# Patient Record
Sex: Male | Born: 1970 | Race: White | Hispanic: No | Marital: Single | Smoking: Never smoker
Health system: Southern US, Community
[De-identification: ages and names within clinical notes are randomized; demographics above are authoritative.]

---

## 2005-01-17 ENCOUNTER — Encounter: Admission: RE | Admit: 2005-01-17 | Discharge: 2005-01-17 | Payer: Self-pay | Admitting: Occupational Medicine

## 2005-03-15 ENCOUNTER — Ambulatory Visit (HOSPITAL_COMMUNITY): Admission: RE | Admit: 2005-03-15 | Discharge: 2005-03-15 | Payer: Self-pay | Admitting: Urology

## 2006-01-04 IMAGING — CT CT PELVIS W/O CM
1 series · 15 of 32 positions shown, 19 images · IV contrast (agent unspecified)
Comparison: Abdominal radiographs done earlier today.

CLINICAL DATA: Right flank pain and nausea, post lithotripsy.  
 ABDOMEN CT WITHOUT CONTRAST:
TECHNIQUE: Multidetector CT imaging of the abdomen was performed following the standard protocol without IV contrast.
TECHNIQUE: Multidetector CT imaging of the pelvis was performed following the standard protocol without IV contrast.

[Series 2: stone_wo 5.0 b40f st · axial · 0.65mm/px · z∈[+1340,+1760]mm · 15 of 117 slices shown, 19 images]
[im 8/117  soft-tissue]
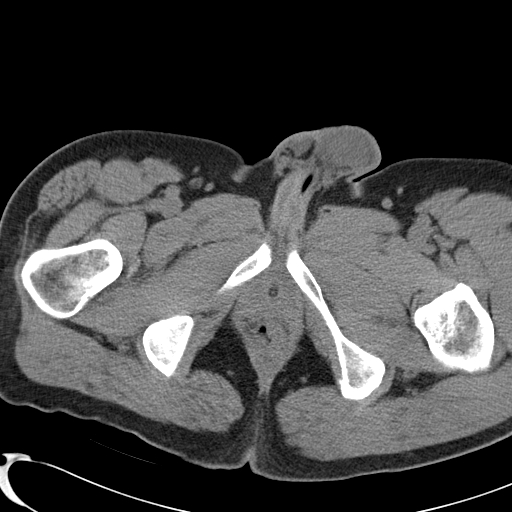
[im 8/117  bone]
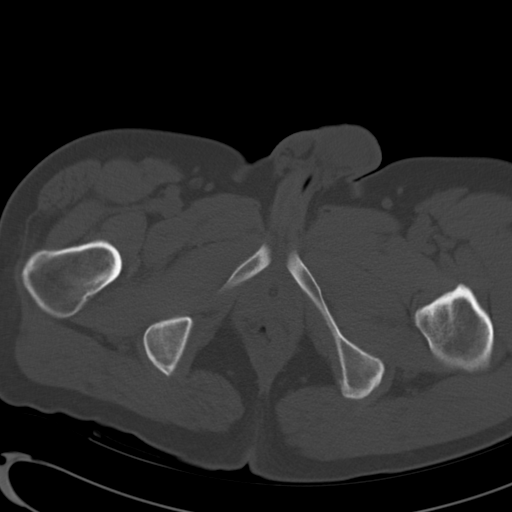
[im 15/117  soft-tissue]
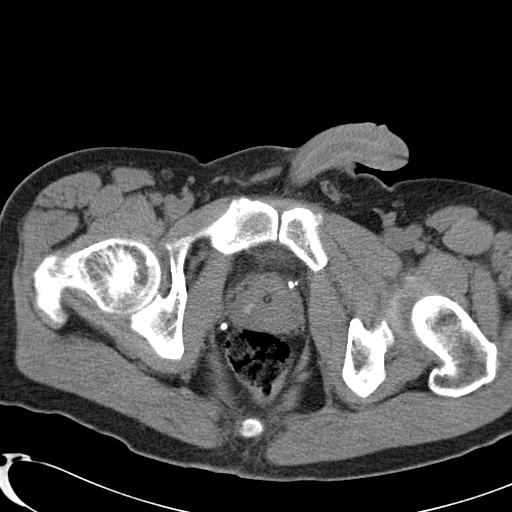
[im 23/117  soft-tissue]
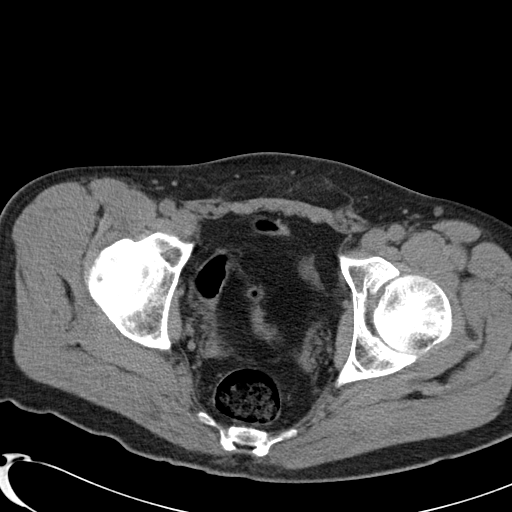
[im 34/117  soft-tissue]
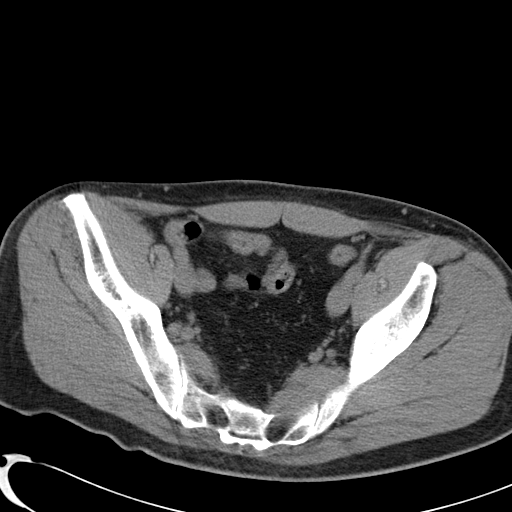
[im 42/117  soft-tissue]
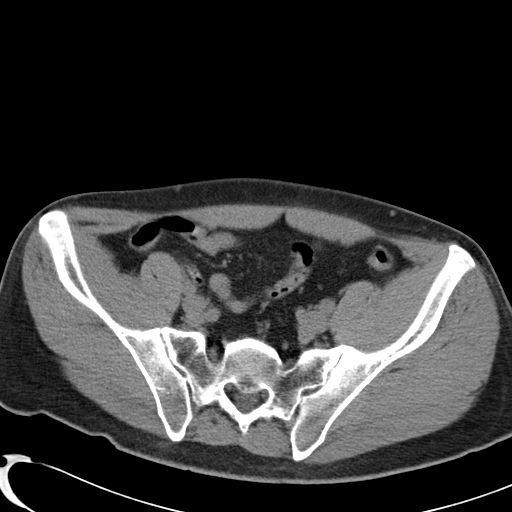
[im 49/117  soft-tissue]
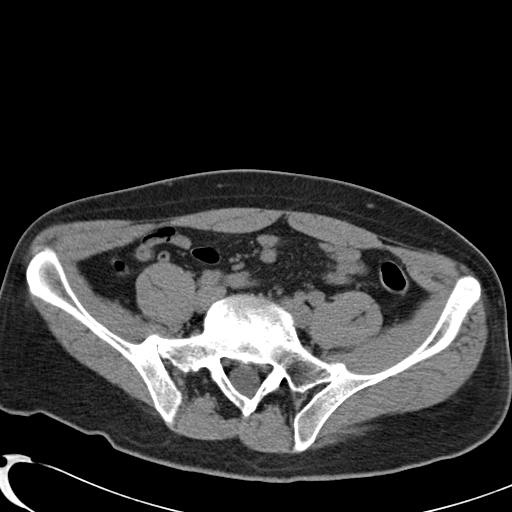
[im 60/117  soft-tissue]
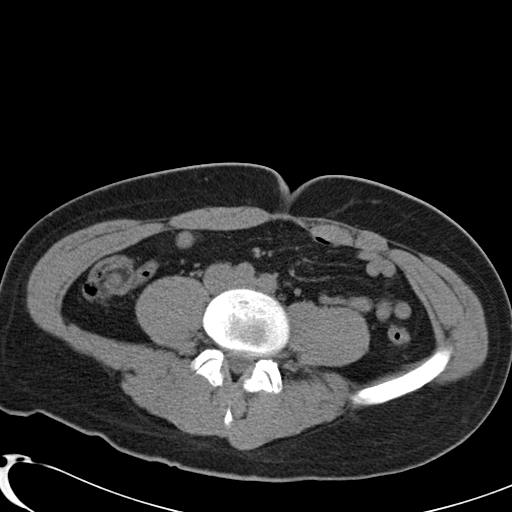
[im 68/117  soft-tissue]
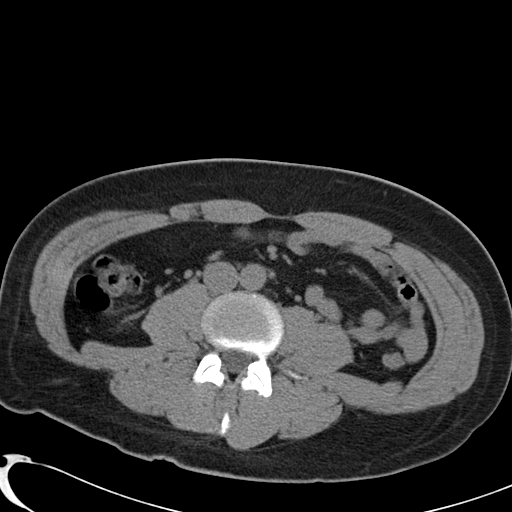
[im 75/117  soft-tissue]
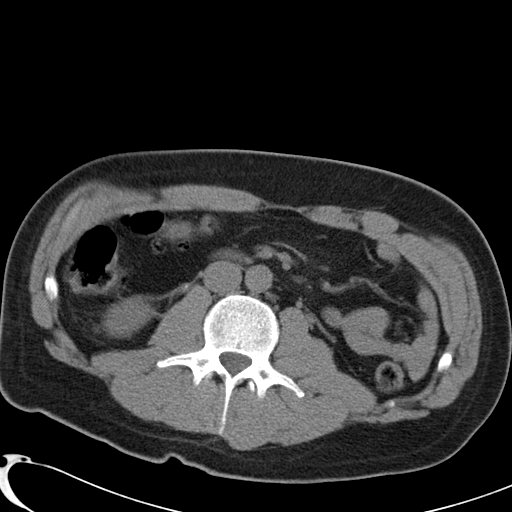
[im 75/117  bone]
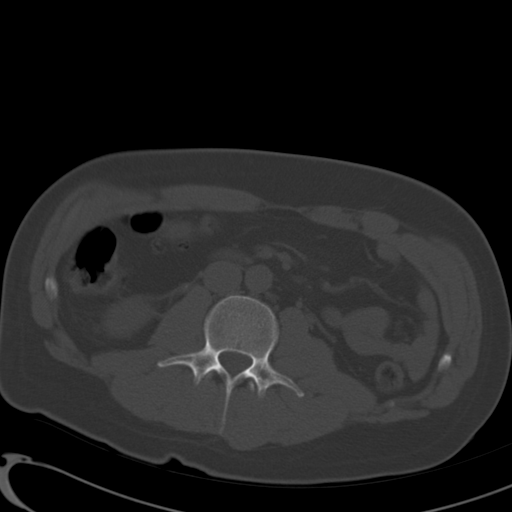
[im 83/117  soft-tissue]
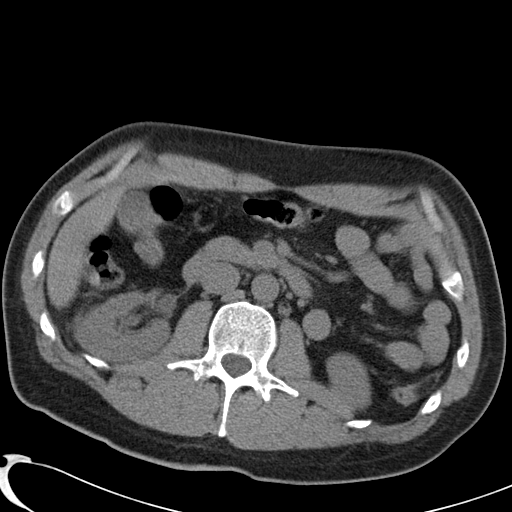
[im 94/117  soft-tissue]
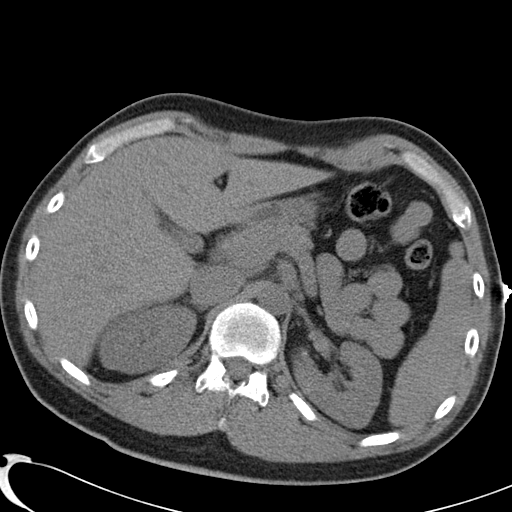
[im 102/117  soft-tissue]
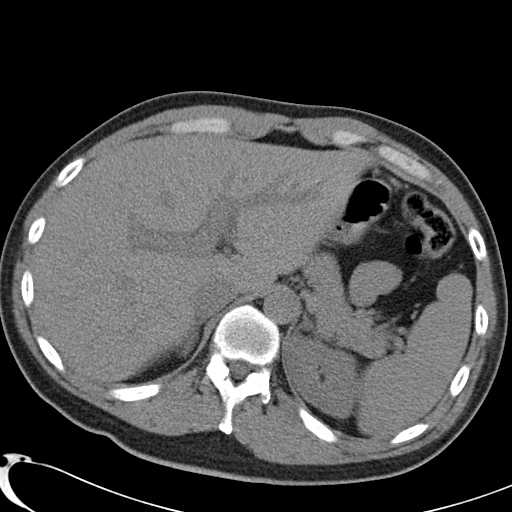
[im 102/117  lung]
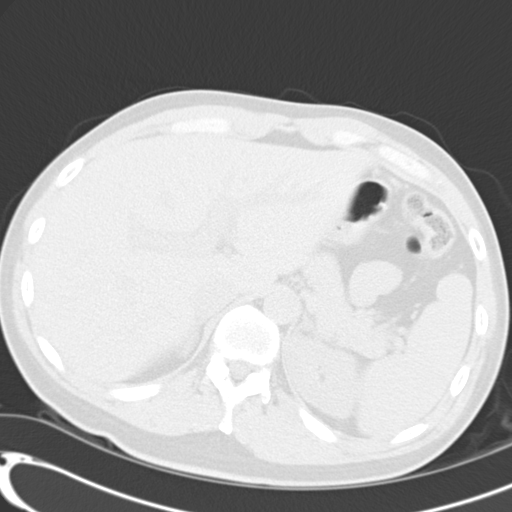
[im 105/117  lung]
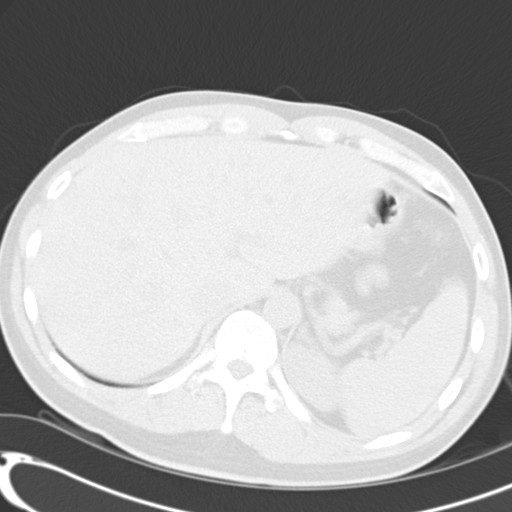
[im 109/117  soft-tissue]
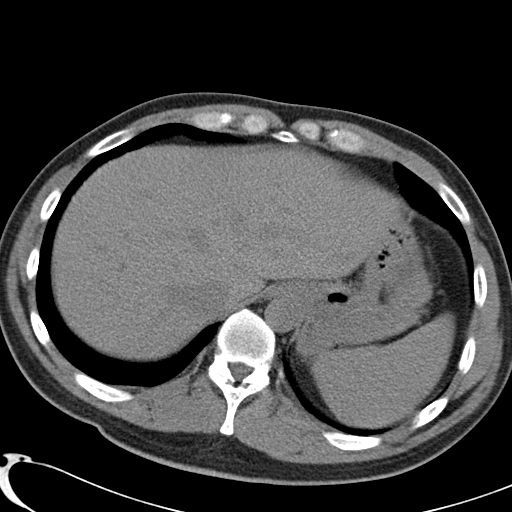
[im 109/117  lung]
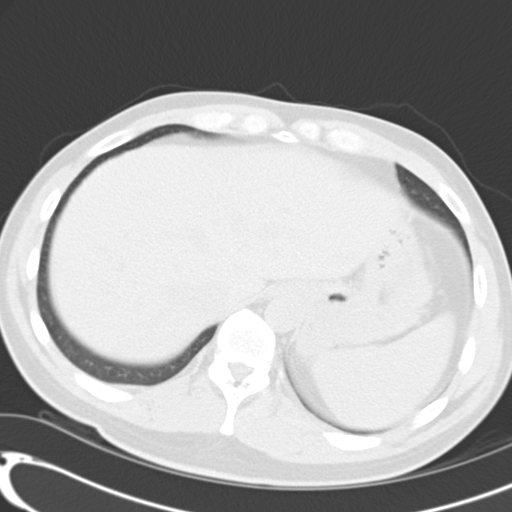
[im 113/117  lung]
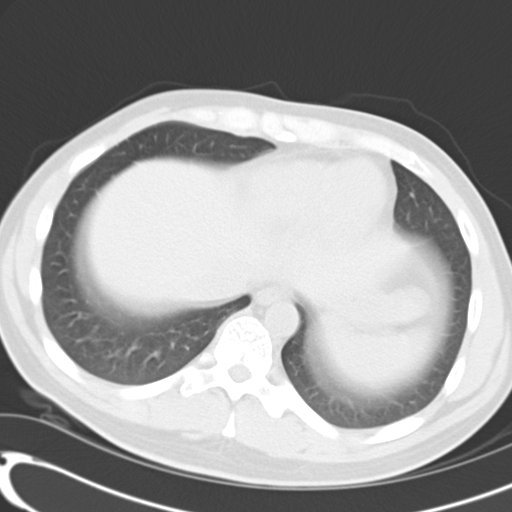

[15 of 32 positions shown; findings below may reference images not displayed]

FINDINGS: The right kidney demonstrates perinephric soft tissue stranding with mild dilatation of the pelvis and proximal ureter. Two small calculi or stone fragments are present within the proximal right ureter, measuring 3 mm on image 39 and 4 mm on image 44.  The ureter appears decompressed distal to this, and no distal right-sided ureteral calculi are seen.  There are no renal calculi on the right.  There is a tiny non-obstructing calculus in the upper pole of the left kidney on image 19.  No left-sided hydronephrosis is present.   There is no perinephric fluid collection.  
 The remainder of the visualized abdomen appears unremarkable as imaged in the unenhanced state. There is no ascites or free intraperitoneal air.
IMPRESSION: Two small ureteral calculi or stone fragments are present in the proximal right ureter status-post reported lithotripsy.  There is mild right-sided hydronephrosis and perinephric soft tissue stranding. 
 PELVIS CT WITHOUT CONTRAST:
FINDINGS: Distally, the ureters are normal in caliber.  No ureteral calculi are seen within the pelvis. The urinary bladder is decompressed by Foley catheter.   There are bilateral pelvic phleboliths.
IMPRESSION: No distal ureteral calculi or hydronephrosis.

## 2007-03-26 ENCOUNTER — Emergency Department (HOSPITAL_COMMUNITY): Admission: EM | Admit: 2007-03-26 | Discharge: 2007-03-26 | Payer: Self-pay | Admitting: Family Medicine

## 2009-11-27 ENCOUNTER — Inpatient Hospital Stay (HOSPITAL_COMMUNITY): Admission: EM | Admit: 2009-11-27 | Discharge: 2009-11-28 | Payer: Self-pay | Admitting: Emergency Medicine

## 2009-11-27 ENCOUNTER — Ambulatory Visit: Payer: Self-pay | Admitting: Internal Medicine

## 2009-11-27 ENCOUNTER — Emergency Department (HOSPITAL_COMMUNITY): Admission: EM | Admit: 2009-11-27 | Discharge: 2009-11-27 | Payer: Self-pay | Admitting: Emergency Medicine

## 2009-11-28 ENCOUNTER — Encounter: Payer: Self-pay | Admitting: Internal Medicine

## 2009-11-30 DIAGNOSIS — R0602 Shortness of breath: Secondary | ICD-10-CM

## 2009-11-30 DIAGNOSIS — R002 Palpitations: Secondary | ICD-10-CM | POA: Insufficient documentation

## 2009-11-30 DIAGNOSIS — I4891 Unspecified atrial fibrillation: Secondary | ICD-10-CM | POA: Insufficient documentation

## 2009-12-07 ENCOUNTER — Ambulatory Visit: Payer: Self-pay

## 2009-12-07 ENCOUNTER — Ambulatory Visit: Payer: Self-pay | Admitting: Internal Medicine

## 2010-09-18 IMAGING — CR DG CHEST 1V PORT
1 series · 1 of 1 positions shown · non-contrast
Comparison: None.

CLINICAL DATA: Short of breath and palpitations.

PORTABLE CHEST - 1 VIEW

[view not recorded]
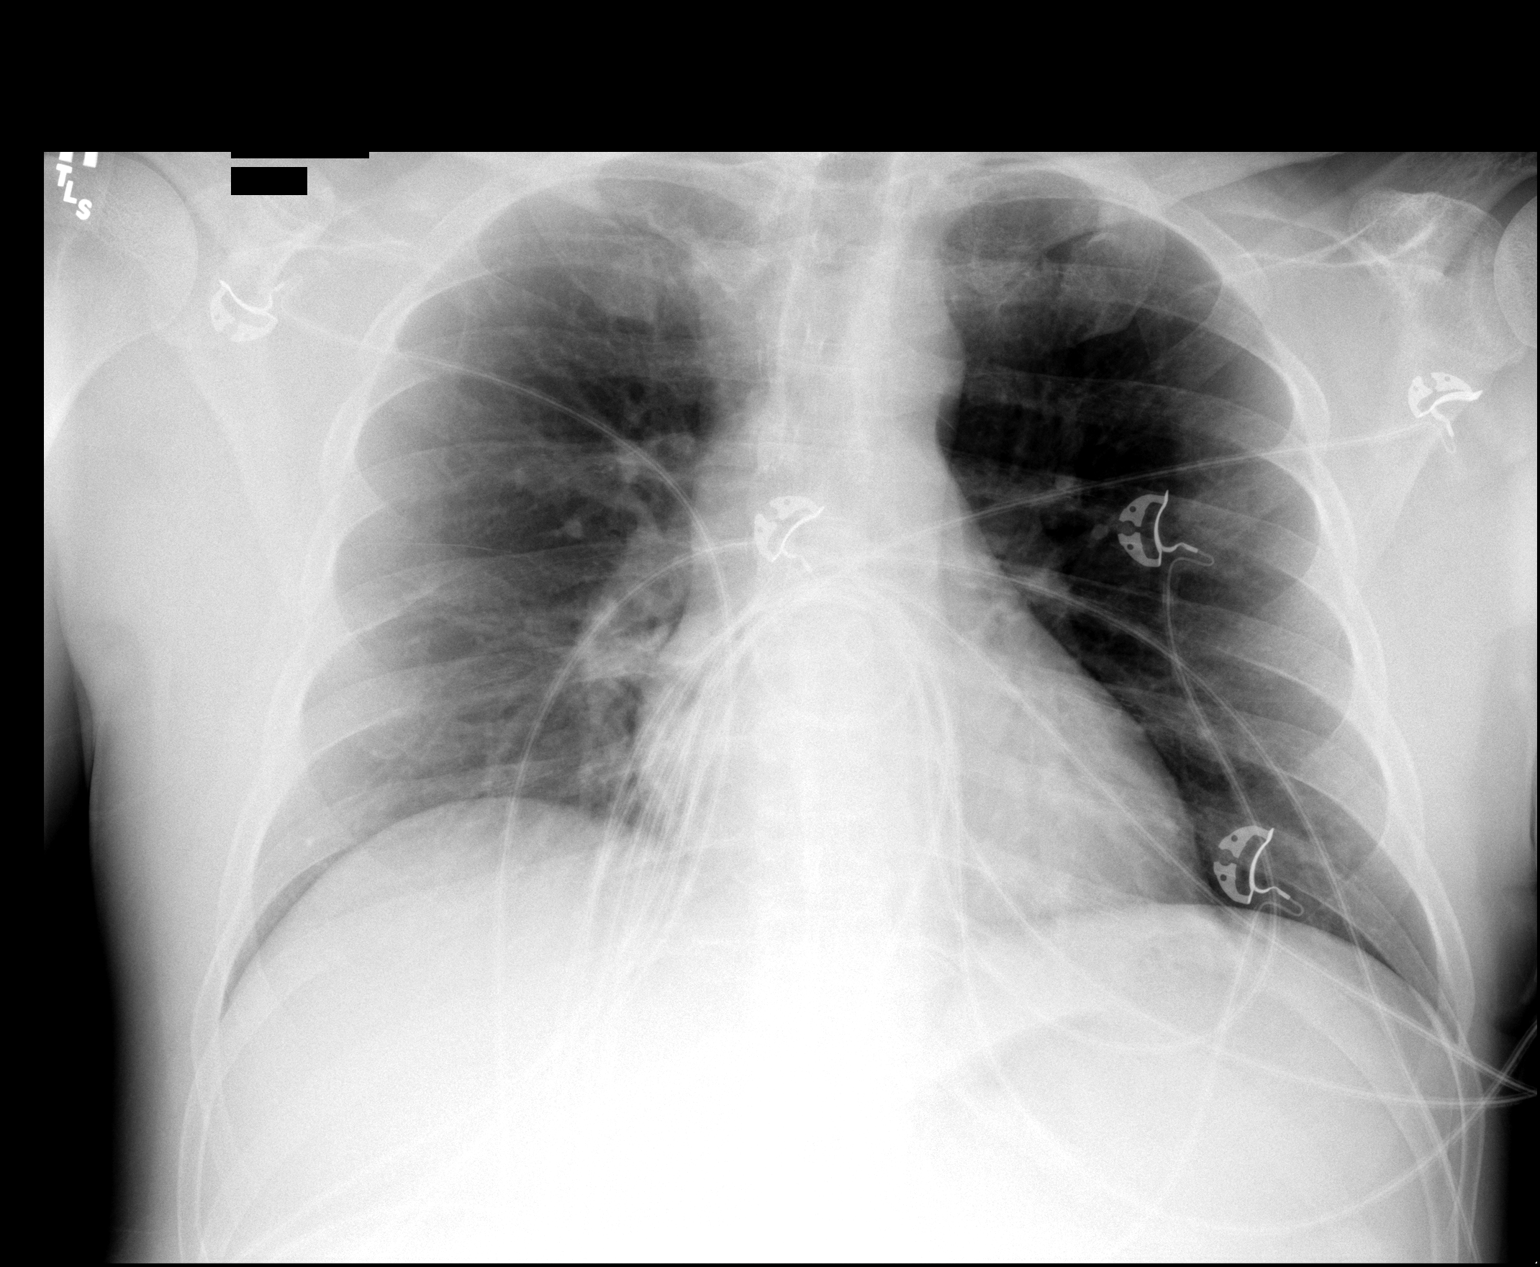

[1 of 1 positions shown; findings below may reference images not displayed]

FINDINGS: Heart size within normal limits.  The vascularity is
normal and there is no infiltrate or edema or effusion.  The lungs
are clear.
IMPRESSION: No active cardiopulmonary disease.

## 2010-10-10 LAB — URINALYSIS, ROUTINE W REFLEX MICROSCOPIC
Ketones, ur: NEGATIVE mg/dL
Nitrite: NEGATIVE
Protein, ur: NEGATIVE mg/dL
pH: 6.5 (ref 5.0–8.0)

## 2010-10-10 LAB — CBC
HCT: 42.5 % (ref 39.0–52.0)
MCV: 83.1 fL (ref 78.0–100.0)
Platelets: 220 10*3/uL (ref 150–400)
Platelets: 244 10*3/uL (ref 150–400)
RDW: 13.7 % (ref 11.5–15.5)
RDW: 14.1 % (ref 11.5–15.5)
WBC: 6 10*3/uL (ref 4.0–10.5)
WBC: 6.5 10*3/uL (ref 4.0–10.5)

## 2010-10-10 LAB — CARDIAC PANEL(CRET KIN+CKTOT+MB+TROPI)
CK, MB: 1.1 ng/mL (ref 0.3–4.0)
Relative Index: INVALID (ref 0.0–2.5)
Total CK: 70 U/L (ref 7–232)
Troponin I: 0.01 ng/mL (ref 0.00–0.06)

## 2010-10-10 LAB — COMPREHENSIVE METABOLIC PANEL
ALT: 15 U/L (ref 0–53)
AST: 22 U/L (ref 0–37)
Albumin: 3.9 g/dL (ref 3.5–5.2)
Alkaline Phosphatase: 88 U/L (ref 39–117)
BUN: 15 mg/dL (ref 6–23)
GFR calc Af Amer: >60 mL/min (ref >60)
GFR calc non Af Amer: >60 mL/min (ref >60)

## 2010-10-10 LAB — LIPID PANEL
Cholesterol: 178 mg/dL (ref 0–200)
HDL: 56 mg/dL (ref >39)
LDL Cholesterol: 111 mg/dL — ABNORMAL HIGH (ref 0–99)

## 2010-10-10 LAB — DIFFERENTIAL
Eosinophils Absolute: 0.1 10*3/uL (ref 0.0–0.7)
Neutrophils Relative %: 64 % (ref 43–77)

## 2010-10-10 LAB — HEPARIN LEVEL (UNFRACTIONATED): Heparin Unfractionated: 0.43 IU/mL (ref 0.30–0.70)

## 2010-10-10 LAB — CK TOTAL AND CKMB (NOT AT ARMC)
CK, MB: 1.3 ng/mL (ref 0.3–4.0)
Total CK: 106 U/L (ref 7–232)

## 2010-10-10 LAB — PROTIME-INR: Prothrombin Time: 12.3 seconds (ref 11.6–15.2)

## 2010-10-10 LAB — TSH: TSH: 1.514 u[IU]/mL (ref 0.350–4.500)

## 2014-07-03 ENCOUNTER — Encounter (HOSPITAL_COMMUNITY): Payer: Self-pay

## 2014-07-03 ENCOUNTER — Emergency Department (INDEPENDENT_AMBULATORY_CARE_PROVIDER_SITE_OTHER)
Admission: EM | Admit: 2014-07-03 | Discharge: 2014-07-03 | Disposition: A | Discharge status: Home / Self Care | Payer: Self-pay | Source: Home / Self Care | Attending: Family Medicine | Admitting: Family Medicine

## 2014-07-03 DIAGNOSIS — S61411A Laceration without foreign body of right hand, initial encounter: Secondary | ICD-10-CM

## 2014-07-03 MED ORDER — TETANUS-DIPHTH-ACELL PERTUSSIS 5-2.5-18.5 LF-MCG/0.5 IM SUSP
0.5000 mL | Freq: Once | INTRAMUSCULAR | Status: AC
  Administered 2014-07-03: 0.5 mL via INTRAMUSCULAR

## 2014-07-03 MED ORDER — TETANUS-DIPHTH-ACELL PERTUSSIS 5-2.5-18.5 LF-MCG/0.5 IM SUSP
INTRAMUSCULAR | Status: AC
  Filled 2014-07-03: qty 0.5

## 2014-07-03 MED ORDER — LIDOCAINE-EPINEPHRINE (PF) 2 %-1:200000 IJ SOLN
INTRAMUSCULAR | Status: AC
  Filled 2014-07-03: qty 20

## 2014-07-03 NOTE — ED Notes (Signed)
Was working on air compressor earlier today that reportedly did not have a guard around the fan . Sustained superficial  laceration in 4th 5th finger webspace. Bleeding controlled , good ROM on exam

## 2014-07-03 NOTE — ED Notes (Signed)
Wound area steri striped, padded w non adherent dressing

## 2014-07-03 NOTE — ED Provider Notes (Signed)
CSN: [id number]     Arrival date & time 07/03/14  1601 History   First MD Initiated Contact with Patient 07/03/14 1640     Chief Complaint  Patient presents with  . Extremity Laceration   (Consider location/radiation/quality/duration/timing/severity/associated sxs/prior Treatment) HPI        43 year old male presents for evaluation of a laceration. He was fixing a fan when it turned on and cut between his fourth and fifth finger of his right hand. This happened 30 minutes prior to arrival. Bleeding has been controlled with direct pressure. No numbness distal to the injury, no other injury. Tetanus is not up-to-date.  History reviewed. No pertinent past medical history. History reviewed. No pertinent past surgical history. History reviewed. No pertinent family history. History  Substance Use Topics  . Smoking status: Never Smoker   . Smokeless tobacco: Not on file  . Alcohol Use: Yes    Review of Systems  Skin: Positive for wound (laceration, see history of present illness).  All other systems reviewed and are negative.   Allergies  Review of patient's allergies indicates no known allergies.  Home Medications   Prior to Admission medications   Not on File   BP 130/79 mmHg  Pulse 82  Temp(Src) 98.8 F (37.1 C) (Oral)  Resp 16  SpO2 97% Physical Exam  Constitutional: He is oriented to person, place, and time. He appears well-developed and well-nourished. No distress.  HENT:  Head: Normocephalic.  Pulmonary/Chest: Effort normal. No respiratory distress.  Musculoskeletal:       Right hand: He exhibits laceration. He exhibits no tenderness, normal capillary refill and no deformity. Normal sensation noted. Normal strength noted.       Hands: Neurological: He is alert and oriented to person, place, and time. Coordination normal.  Skin: Skin is warm and dry. No rash noted. He is not diaphoretic.  Psychiatric: He has a normal mood and affect. Judgment normal.  Nursing note  and vitals reviewed.   ED Course  LACERATION REPAIR Date/Time: 07/03/2014 4:58 PM Performed by: Autumn MessingBAKER, Adriel Desrosier, H Authorized by: Clementeen GrahamOREY, EVAN, S Consent: Verbal consent obtained. Risks and benefits: risks, benefits and alternatives were discussed Consent given by: patient Patient identity confirmed: verbally with patient Time out: Immediately prior to procedure a "time out" was called to verify the correct patient, procedure, equipment, support staff and site/side marked as required. Body area: upper extremity Location details: right hand Laceration length: 1.5 cm Foreign bodies: no foreign bodies Tendon involvement: none Nerve involvement: none Vascular damage: yes Anesthesia: local infiltration Local anesthetic: lidocaine 2% with epinephrine Anesthetic total: 6 ml Preparation: Patient was prepped and draped in the usual sterile fashion. Irrigation solution: tap water Irrigation method: tap Amount of cleaning: extensive Debridement: none Degree of undermining: none Skin closure: glue Technique: simple Approximation: close Approximation difficulty: simple   (including critical care time) Labs Review Labs Reviewed - No data to display  Imaging Review No results found.   MDM   1. Laceration of hand, right, initial encounter    Laceration repaired with skin glue, watch for signs of infection, follow-up when necessary. TDaP given today     Graylon GoodZachary H Mrk Buzby, PA-C 07/03/14 1801

## 2014-07-03 NOTE — Discharge Instructions (Signed)

## 2023-10-22 DEATH — deceased
